# Patient Record
Sex: Male | Born: 1945 | Race: White | Hispanic: No
Health system: Southern US, Community
[De-identification: ages and names within clinical notes are randomized; demographics above are authoritative.]

## PROBLEM LIST (undated history)

## (undated) DIAGNOSIS — E119 Type 2 diabetes mellitus without complications: Secondary | ICD-10-CM

## (undated) HISTORY — PX: CARDIAC VALVE REPLACEMENT: SHX585

---

## 2012-10-06 ENCOUNTER — Ambulatory Visit: Payer: Self-pay | Admitting: Gastroenterology

## 2012-10-07 LAB — PATHOLOGY REPORT

## 2015-11-09 ENCOUNTER — Emergency Department: Payer: Medicare Other

## 2015-11-09 ENCOUNTER — Emergency Department
Admission: EM | Admit: 2015-11-09 | Discharge: 2015-11-09 | Disposition: A | Discharge status: Home / Self Care | Payer: Medicare Other | Attending: Emergency Medicine | Admitting: Emergency Medicine

## 2015-11-09 DIAGNOSIS — N200 Calculus of kidney: Secondary | ICD-10-CM

## 2015-11-09 DIAGNOSIS — N201 Calculus of ureter: Secondary | ICD-10-CM | POA: Diagnosis not present

## 2015-11-09 DIAGNOSIS — R1031 Right lower quadrant pain: Secondary | ICD-10-CM | POA: Diagnosis present

## 2015-11-09 DIAGNOSIS — R109 Unspecified abdominal pain: Secondary | ICD-10-CM

## 2015-11-09 LAB — URINALYSIS COMPLETE WITH MICROSCOPIC (ARMC ONLY)
Bilirubin Urine: NEGATIVE
Glucose, UA: >500 mg/dL — AB
LEUKOCYTES UA: NEGATIVE
Nitrite: NEGATIVE
PH: 5 (ref 5.0–8.0)
PROTEIN: 30 mg/dL — AB
SPECIFIC GRAVITY, URINE: 1.022 (ref 1.005–1.030)

## 2015-11-09 LAB — CBC WITH DIFFERENTIAL/PLATELET
BASOS PCT: 1 %
Basophils Absolute: 0.1 10*3/uL (ref 0–0.1)
EOS ABS: 0 10*3/uL (ref 0–0.7)
EOS PCT: 0 %
HCT: 45.1 % (ref 40.0–52.0)
Hemoglobin: 15.7 g/dL (ref 13.0–18.0)
LYMPHS ABS: 0.4 10*3/uL — AB (ref 1.0–3.6)
Lymphocytes Relative: 4 %
MCH: 32.6 pg (ref 26.0–34.0)
MCHC: 34.8 g/dL (ref 32.0–36.0)
MCV: 93.6 fL (ref 80.0–100.0)
MONOS PCT: 5 %
Monocytes Absolute: 0.6 10*3/uL (ref 0.2–1.0)
NEUTROS PCT: 90 %
Neutro Abs: 10.2 10*3/uL — ABNORMAL HIGH (ref 1.4–6.5)
Platelets: 215 10*3/uL (ref 150–440)
RBC: 4.82 MIL/uL (ref 4.40–5.90)
RDW: 13.5 % (ref 11.5–14.5)
WBC: 11.2 10*3/uL — ABNORMAL HIGH (ref 3.8–10.6)

## 2015-11-09 LAB — COMPREHENSIVE METABOLIC PANEL
ALBUMIN: 4.5 g/dL (ref 3.5–5.0)
ALT: 18 U/L (ref 17–63)
ANION GAP: 10 (ref 5–15)
AST: 24 U/L (ref 15–41)
Alkaline Phosphatase: 77 U/L (ref 38–126)
BUN: 21 mg/dL — ABNORMAL HIGH (ref 6–20)
CHLORIDE: 104 mmol/L (ref 101–111)
CO2: 26 mmol/L (ref 22–32)
Calcium: 9.5 mg/dL (ref 8.9–10.3)
Creatinine, Ser: 1.3 mg/dL — ABNORMAL HIGH (ref 0.61–1.24)
GFR calc Af Amer: >60 mL/min (ref >60)
GFR calc non Af Amer: 53 mL/min — ABNORMAL LOW (ref >60)
GLUCOSE: 355 mg/dL — AB (ref 65–99)
Potassium: 4.5 mmol/L (ref 3.5–5.1)
SODIUM: 140 mmol/L (ref 135–145)
Total Bilirubin: 1.3 mg/dL — ABNORMAL HIGH (ref 0.3–1.2)
Total Protein: 7.4 g/dL (ref 6.5–8.1)

## 2015-11-09 LAB — LIPASE, BLOOD: Lipase: 20 U/L (ref 11–51)

## 2015-11-09 MED ORDER — DEXTROSE 5 % IV SOLN
1.0000 g | Freq: Once | INTRAVENOUS | Status: AC
  Administered 2015-11-09: 1 g via INTRAVENOUS
  Filled 2015-11-09: qty 10

## 2015-11-09 MED ORDER — MORPHINE SULFATE (PF) 4 MG/ML IV SOLN
4.0000 mg | Freq: Once | INTRAVENOUS | Status: AC
  Administered 2015-11-09: 4 mg via INTRAVENOUS
  Filled 2015-11-09: qty 1

## 2015-11-09 MED ORDER — SODIUM CHLORIDE 0.9 % IV BOLUS (SEPSIS)
1000.0000 mL | Freq: Once | INTRAVENOUS | Status: AC
  Administered 2015-11-09: 1000 mL via INTRAVENOUS

## 2015-11-09 MED ORDER — TRAMADOL HCL 50 MG PO TABS: 50.0000 mg | ORAL_TABLET | Freq: Four times a day (QID) | ORAL | 0 refills | Status: AC | PRN

## 2015-11-09 MED ORDER — ONDANSETRON HCL 4 MG/2ML IJ SOLN
4.0000 mg | Freq: Once | INTRAMUSCULAR | Status: AC
  Administered 2015-11-09: 4 mg via INTRAVENOUS
  Filled 2015-11-09: qty 2

## 2015-11-09 MED ORDER — ONDANSETRON HCL 4 MG PO TABS: 4.0000 mg | ORAL_TABLET | Freq: Three times a day (TID) | ORAL | 0 refills | Status: DC | PRN

## 2015-11-09 MED ORDER — TAMSULOSIN HCL 0.4 MG PO CAPS: 0.4000 mg | ORAL_CAPSULE | Freq: Every day | ORAL | 0 refills | Status: DC

## 2015-11-09 MED ORDER — CEPHALEXIN 500 MG PO CAPS: 500.0000 mg | ORAL_CAPSULE | Freq: Three times a day (TID) | ORAL | 0 refills | Status: DC

## 2015-11-09 NOTE — Discharge Instructions (Signed)
As we discussed please talk to Dr. Burnadette Pop about obtaining further imaging of your kidneys. Please seek medical attention for any high fevers, chest pain, shortness of breath, change in behavior, persistent vomiting, bloody stool or any other new or concerning symptoms.

## 2015-11-09 NOTE — ED Notes (Signed)
Pt alert and oriented X4, active, cooperative, pt in NAD. RR even and unlabored, color WNL.  Pt informed to return if any life threatening symptoms occur.  Pt instructed not to drive home himself, that he needs to call ride due to medications given in ER. Pt verbalizes understanding.

## 2015-11-09 NOTE — ED Provider Notes (Signed)
[facility] Emergency Department Provider Note   ____________________________________________   I have reviewed the triage vital signs and the nursing notes.   HISTORY  Chief Complaint Abdominal Pain   History limited by: Not Limited   HPI Dominic Valentine is a 70 y.o. male who presents to the emergency department today because of concerns for abdominal pain. It started roughly 9 hours ago. It is located in the right lower quadrant. It is been a constant dull pain with occasional episodes of more severe pain. He has had some associated nausea but no vomiting. Hasn't noticed any recent dysuria or bad odor. No fevers. Denies similar symptoms in the past. States he does have family history of kidney stones.   No past medical history on file.  There are no active problems to display for this patient.   No past surgical history on file.  Prior to Admission medications   Not on File    Allergies Review of patient's allergies indicates no known allergies.  No family history on file.  Social History Social History  Substance Use Topics  . Smoking status: Not on file  . Smokeless tobacco: Not on file  . Alcohol use Not on file    Review of Systems  Constitutional: Negative for fever. Cardiovascular: Negative for chest pain. Respiratory: Negative for shortness of breath. Gastrointestinal: Positive right lower quadrant abdominal pain Genitourinary: Negative for dysuria. Musculoskeletal: Negative for back pain. Skin: Negative for rash. Neurological: Negative for headaches, focal weakness or numbness.  10-point ROS otherwise negative.  ____________________________________________   PHYSICAL EXAM:  VITAL SIGNS: ED Triage Vitals  Enc Vitals Group     BP 11/09/15 0428 (!) 177/99     Pulse Rate 11/09/15 0428 78     Resp 11/09/15 0428 18     Temp 11/09/15 0428 99.3 F (37.4 C)     Temp Source 11/09/15 0428 Oral     SpO2 11/09/15 0428 99 %      Weight 11/09/15 0422 170 lb (77.1 kg)     Height 11/09/15 0422 '5\' 11"'  (1.803 m)     Head Circumference --      Peak Flow --      Pain Score 11/09/15 0422 5   Constitutional: Alert and oriented. Well appearing and in no distress. Eyes: Conjunctivae are normal. Normal extraocular movements. ENT   Head: Normocephalic and atraumatic.   Nose: No congestion/rhinnorhea.   Mouth/Throat: Mucous membranes are moist.   Neck: No stridor. Hematological/Lymphatic/Immunilogical: No cervical lymphadenopathy. Cardiovascular: Normal rate, regular rhythm.  No murmurs, rubs, or gallops. Respiratory: Normal respiratory effort without tachypnea nor retractions. Breath sounds are clear and equal bilaterally. No wheezes/rales/rhonchi. Gastrointestinal: Soft and nontender. No distention.  Genitourinary: Deferred Musculoskeletal: Normal range of motion in all extremities. No lower extremity edema. Neurologic:  Normal speech and language. No gross focal neurologic deficits are appreciated.  Skin:  Skin is warm, dry and intact. No rash noted. Psychiatric: Mood and affect are normal. Speech and behavior are normal. Patient exhibits appropriate insight and judgment.  ____________________________________________    LABS (pertinent positives/negatives)  Labs Reviewed  CBC WITH DIFFERENTIAL/PLATELET - Abnormal; Notable for the following:       Result Value   WBC 11.2 (*)    Neutro Abs 10.2 (*)    Lymphs Abs 0.4 (*)    All other components within normal limits  COMPREHENSIVE METABOLIC PANEL - Abnormal; Notable for the following:    Glucose, Bld 355 (*)    BUN  21 (*)    Creatinine, Ser 1.30 (*)    Total Bilirubin 1.3 (*)    GFR calc non Af Amer 53 (*)    All other components within normal limits  URINALYSIS COMPLETEWITH MICROSCOPIC ([facility] ONLY) - Abnormal; Notable for the following:    Color, Urine YELLOW (*)    APPearance HAZY (*)    Glucose, UA >500 (*)    Ketones, ur 2+ (*)    Hgb  urine dipstick 3+ (*)    Protein, ur 30 (*)    Bacteria, UA RARE (*)    Squamous Epithelial / LPF 0-5 (*)    All other components within normal limits  LIPASE, BLOOD     ____________________________________________   EKG  None  ____________________________________________    RADIOLOGY  CT renal IMPRESSION:  1. Obstructive 5 mm stone at the right UVJ with secondary mild right  hydroureteronephrosis.  2. Additional 4 mm stone at the left UVJ without significant  left-sided hydronephrosis or hydroureter.  3. Multiple additional layering stones within the bladder lumen.  4. 5.3 cm mildly complex left renal cyst. Full characterization with  dedicated renal mass CT and/or MRI is recommended.  5. Colonic diverticulosis without evidence for acute diverticulitis.  6. 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  7. Mottled appearance of the visualized osseous structure with  question of subtle superimposed lucent lesions. While this finding  may be benign in nature, possible multiple myeloma could also have  this appearance. Correlation with laboratory values recommended.     ____________________________________________   PROCEDURES  Procedures  ____________________________________________   INITIAL IMPRESSION / ASSESSMENT AND PLAN / ED COURSE  Pertinent labs & imaging results that were available during my care of the patient were reviewed by me and considered in my medical decision making (see chart for details).  Patient presented to the emergency department today because of concerns for right flank pain. Patient did have some blood in his urine. CT scan was obtained which did show a 5 mm stone at the right UVJ. This is likely the cause of the patient's pain. Discussed this with the patient appeared also discussed with patient finding of complex cyst requiring further imaging. Will give patient does of antibiotics here given that he had some  white blood cells in his urine and very mild leukocytosis. I did however doubt that he truly has an infected stone at this point. Will give patient urology follow-up. Will also encourage primary care follow-up ____________________________________________   FINAL CLINICAL IMPRESSION(S) / ED DIAGNOSES  Final diagnoses:  Right flank pain  Kidney stone     Note: This dictation was prepared with Dragon dictation. Any transcriptional errors that result from this process are unintentional    Nance Pear, MD 11/09/15 [phone]

## 2015-11-09 NOTE — ED Notes (Signed)
Intermittent pain to the LLQ pt states "I feel like it's in my gut." Pt reports the pain starting last night after supper.

## 2015-11-09 NOTE — ED Triage Notes (Signed)
Patient ambulatory to triage with steady gait, without difficulty or distress noted; pt reports right lower abd pain since last night accomp by nausea 

## 2015-11-09 NOTE — ED Notes (Signed)
When asked pt how he felt pt states "I actually feel better."

## 2017-06-06 ENCOUNTER — Other Ambulatory Visit
Admission: RE | Admit: 2017-06-06 | Discharge: 2017-06-06 | Disposition: A | Discharge status: Home / Self Care | Payer: Medicare Other | Source: Ambulatory Visit | Attending: Family Medicine | Admitting: Family Medicine

## 2017-06-06 DIAGNOSIS — R791 Abnormal coagulation profile: Secondary | ICD-10-CM | POA: Diagnosis present

## 2017-06-06 LAB — PROTIME-INR
INR: 1.52
Prothrombin Time: 18.2 seconds — ABNORMAL HIGH (ref 11.4–15.2)

## 2017-10-14 ENCOUNTER — Encounter: Payer: Self-pay | Admitting: Anesthesiology

## 2017-10-14 ENCOUNTER — Ambulatory Visit: Payer: Medicare Other | Admitting: Anesthesiology

## 2017-10-14 ENCOUNTER — Ambulatory Visit
Admission: RE | Admit: 2017-10-14 | Discharge: 2017-10-14 | Disposition: A | Discharge status: Home / Self Care | Payer: Medicare Other | Source: Ambulatory Visit | Attending: Internal Medicine | Admitting: Internal Medicine

## 2017-10-14 ENCOUNTER — Encounter
Admission: RE | Disposition: A | Discharge status: Home / Self Care | Payer: Self-pay | Source: Ambulatory Visit | Attending: Internal Medicine

## 2017-10-14 DIAGNOSIS — E785 Hyperlipidemia, unspecified: Secondary | ICD-10-CM | POA: Insufficient documentation

## 2017-10-14 DIAGNOSIS — K640 First degree hemorrhoids: Secondary | ICD-10-CM | POA: Diagnosis not present

## 2017-10-14 DIAGNOSIS — I10 Essential (primary) hypertension: Secondary | ICD-10-CM | POA: Insufficient documentation

## 2017-10-14 DIAGNOSIS — I2510 Atherosclerotic heart disease of native coronary artery without angina pectoris: Secondary | ICD-10-CM | POA: Diagnosis not present

## 2017-10-14 DIAGNOSIS — R011 Cardiac murmur, unspecified: Secondary | ICD-10-CM | POA: Insufficient documentation

## 2017-10-14 DIAGNOSIS — K5730 Diverticulosis of large intestine without perforation or abscess without bleeding: Secondary | ICD-10-CM | POA: Insufficient documentation

## 2017-10-14 DIAGNOSIS — Z952 Presence of prosthetic heart valve: Secondary | ICD-10-CM | POA: Insufficient documentation

## 2017-10-14 DIAGNOSIS — Z89022 Acquired absence of left finger(s): Secondary | ICD-10-CM | POA: Diagnosis not present

## 2017-10-14 DIAGNOSIS — Z803 Family history of malignant neoplasm of breast: Secondary | ICD-10-CM | POA: Insufficient documentation

## 2017-10-14 DIAGNOSIS — Z8711 Personal history of peptic ulcer disease: Secondary | ICD-10-CM | POA: Insufficient documentation

## 2017-10-14 DIAGNOSIS — Z79899 Other long term (current) drug therapy: Secondary | ICD-10-CM | POA: Insufficient documentation

## 2017-10-14 DIAGNOSIS — E119 Type 2 diabetes mellitus without complications: Secondary | ICD-10-CM | POA: Diagnosis not present

## 2017-10-14 DIAGNOSIS — B351 Tinea unguium: Secondary | ICD-10-CM | POA: Insufficient documentation

## 2017-10-14 DIAGNOSIS — I428 Other cardiomyopathies: Secondary | ICD-10-CM | POA: Diagnosis not present

## 2017-10-14 DIAGNOSIS — Z86010 Personal history of colonic polyps: Secondary | ICD-10-CM | POA: Insufficient documentation

## 2017-10-14 DIAGNOSIS — Z794 Long term (current) use of insulin: Secondary | ICD-10-CM | POA: Insufficient documentation

## 2017-10-14 DIAGNOSIS — Z7901 Long term (current) use of anticoagulants: Secondary | ICD-10-CM | POA: Diagnosis not present

## 2017-10-14 DIAGNOSIS — R0600 Dyspnea, unspecified: Secondary | ICD-10-CM | POA: Insufficient documentation

## 2017-10-14 DIAGNOSIS — Z1211 Encounter for screening for malignant neoplasm of colon: Secondary | ICD-10-CM | POA: Diagnosis present

## 2017-10-14 HISTORY — PX: COLONOSCOPY WITH PROPOFOL: SHX5780

## 2017-10-14 HISTORY — DX: Type 2 diabetes mellitus without complications: E11.9

## 2017-10-14 LAB — GLUCOSE, CAPILLARY: GLUCOSE-CAPILLARY: 157 mg/dL — AB (ref 70–99)

## 2017-10-14 SURGERY — COLONOSCOPY WITH PROPOFOL
Anesthesia: General

## 2017-10-14 MED ORDER — FENTANYL CITRATE (PF) 100 MCG/2ML IJ SOLN
INTRAMUSCULAR | Status: DC | PRN
  Administered 2017-10-14: 50 ug via INTRAVENOUS

## 2017-10-14 MED ORDER — SODIUM CHLORIDE 0.9 % IV SOLN
INTRAVENOUS | Status: DC
  Administered 2017-10-14: 1000 mL via INTRAVENOUS

## 2017-10-14 MED ORDER — LIDOCAINE HCL (PF) 1 % IJ SOLN
INTRAMUSCULAR | Status: AC
  Administered 2017-10-14: 0.3 mL via INTRADERMAL
  Filled 2017-10-14: qty 2

## 2017-10-14 MED ORDER — PROPOFOL 500 MG/50ML IV EMUL
INTRAVENOUS | Status: AC
  Filled 2017-10-14: qty 50

## 2017-10-14 MED ORDER — MIDAZOLAM HCL 2 MG/2ML IJ SOLN
INTRAMUSCULAR | Status: AC
Start: 2017-10-14 — End: ?
  Filled 2017-10-14: qty 2

## 2017-10-14 MED ORDER — LIDOCAINE HCL (PF) 1 % IJ SOLN
2.0000 mL | Freq: Once | INTRAMUSCULAR | Status: AC
  Administered 2017-10-14: 0.3 mL via INTRADERMAL

## 2017-10-14 MED ORDER — FENTANYL CITRATE (PF) 100 MCG/2ML IJ SOLN
INTRAMUSCULAR | Status: AC
  Filled 2017-10-14: qty 2

## 2017-10-14 MED ORDER — PROPOFOL 500 MG/50ML IV EMUL
INTRAVENOUS | Status: DC | PRN
  Administered 2017-10-14: 120 ug/kg/min via INTRAVENOUS

## 2017-10-14 MED ORDER — MIDAZOLAM HCL 2 MG/2ML IJ SOLN
INTRAMUSCULAR | Status: DC | PRN
  Administered 2017-10-14: 1 mg via INTRAVENOUS

## 2017-10-14 NOTE — Transfer of Care (Signed)
Immediate Anesthesia Transfer of Care Note  Patient: Dominic Valentine  Procedure(s) Performed: COLONOSCOPY WITH PROPOFOL (N/A )  Patient Location: PACU  Anesthesia Type:General  Level of Consciousness: awake and sedated  Airway & Oxygen Therapy: Patient Spontanous Breathing and Patient connected to nasal cannula oxygen  Post-op Assessment: Report given to RN and Post -op Vital signs reviewed and stable  Post vital signs: Reviewed and stable  Last Vitals:  Vitals Value Taken Time  BP    Temp    Pulse    Resp    SpO2      Last Pain:  Vitals:   10/14/17 1219  TempSrc: Tympanic  PainSc: 0-No pain         Complications: No apparent anesthesia complications

## 2017-10-14 NOTE — Anesthesia Post-op Follow-up Note (Signed)
Anesthesia QCDR form completed.        

## 2017-10-14 NOTE — Interval H&P Note (Signed)
History and Physical Interval Note:  10/14/2017 1:06 PM  Dominic Valentine  has presented today for surgery, with the diagnosis of PERSONAL HX.OF COLON POLYPS  The various methods of treatment have been discussed with the patient and family. After consideration of risks, benefits and other options for treatment, the patient has consented to  Procedure(s): COLONOSCOPY WITH PROPOFOL (N/A) as a surgical intervention .  The patient's history has been reviewed, patient examined, no change in status, stable for surgery.  I have reviewed the patient's chart and labs.  Questions were answered to the patient's satisfaction.     [place], [place]

## 2017-10-14 NOTE — Anesthesia Postprocedure Evaluation (Signed)
Anesthesia Post Note  Patient: Dominic Valentine  Procedure(s) Performed: COLONOSCOPY WITH PROPOFOL (N/A )  Patient location during evaluation: Endoscopy Anesthesia Type: General Level of consciousness: awake and alert Pain management: pain level controlled Vital Signs Assessment: post-procedure vital signs reviewed and stable Respiratory status: spontaneous breathing and respiratory function stable Cardiovascular status: stable Anesthetic complications: no     Last Vitals:  Vitals:   10/14/17 1219  BP: 139/87  Pulse: 81  Resp: 17  Temp: (!) 36 C  SpO2: 100%    Last Pain:  Vitals:   10/14/17 1219  TempSrc: Tympanic  PainSc: 0-No pain                 Yaileen Hofferber K

## 2017-10-14 NOTE — H&P (Signed)
Outpatient short stay form Pre-procedure 10/14/2017 1:05 PM Dominic Valentine K. Dominic Valentine, M.D.  Primary Physician: Dominic Valentine, M.D.  Reason for visit: Personal hx of colon polyps.  History of present illness:  Patient presents for colonoscopy for a Personal hx of colon polyps.Patient denies change in bowel habits, rectal bleeding, weight loss or abdominal pain.     Current Facility-Administered Medications:  .  0.9 %  sodium chloride infusion, , Intravenous, Continuous, Savage, Boykin Nearing, MD, Last Rate: 20 mL/hr at 10/14/17 1238, 1,000 mL at 10/14/17 1238  Medications Prior to Admission  Medication Sig Dispense Refill Last Dose  . atorvastatin (LIPITOR) 40 MG tablet Take 40 mg by mouth daily.     Marland Kitchen b complex vitamins capsule Take 1 capsule by mouth daily.     . Cholecalciferol 10000 units TABS Take by mouth daily.     . CHROMIUM PO Take by mouth daily.     Marland Kitchen co-enzyme Q-10 30 MG capsule Take 30 mg by mouth every other day.     Marland Kitchen glimepiride (AMARYL) 4 MG tablet Take 4 mg by mouth daily with breakfast.     . insulin NPH-regular Human (NOVOLIN 70/30) (70-30) 100 UNIT/ML injection Inject 10 Units into the skin 2 (two) times daily with a meal.   10/14/2017 at 0800  . lisinopril (PRINIVIL,ZESTRIL) 5 MG tablet Take 5 mg by mouth daily.     . metFORMIN (GLUCOPHAGE) 1000 MG tablet Take 1,000 mg by mouth 2 (two) times daily with a meal. Take 1 qam and 1.5 qpm     . warfarin (COUMADIN) 10 MG tablet Take 10 mg by mouth daily. Qd  Except 12mg  on sat/sun   10/11/2017  . Zinc 50 MG CAPS Take by mouth daily.     . cephALEXin (KEFLEX) 500 MG capsule Take 1 capsule (500 mg total) by mouth 3 (three) times daily. 30 capsule 0   . ondansetron (ZOFRAN) 4 MG tablet Take 1 tablet (4 mg total) by mouth every 8 (eight) hours as needed. 20 tablet 0   . tamsulosin (FLOMAX) 0.4 MG CAPS capsule Take 1 capsule (0.4 mg total) by mouth daily. (Patient not taking: Reported on 10/13/2017) 14 capsule 0 Completed Course at  Unknown time     No Known Allergies   Past Medical History:  Diagnosis Date  . Diabetes mellitus without complication (HCC)     Review of systems:  Otherwise negative.    Physical Exam  Gen: Alert, oriented. Appears stated age.  HEENT: [place]/AT. PERRLA. Lungs: CTA, no wheezes. CV: RR nl S1, S2. Abd: soft, benign, no masses. BS+ Ext: No edema. Pulses 2+    Planned procedures: Proceed with colonoscopy. The patient understands the nature of the planned procedure, indications, risks, alternatives and potential complications including but not limited to bleeding, infection, perforation, damage to internal organs and possible oversedation/side effects from anesthesia. The patient agrees and gives consent to proceed.  Please refer to procedure notes for findings, recommendations and patient disposition/instructions.     Lamija Besse K. Dominic Valentine, M.D. Gastroenterology 10/14/2017  1:05 PM

## 2017-10-14 NOTE — Anesthesia Preprocedure Evaluation (Signed)
Anesthesia Evaluation  Patient identified by MRN, date of birth, ID band Patient awake    Reviewed: Allergy & Precautions, NPO status , Patient's Chart, lab work & pertinent test results, reviewed documented beta blocker date and time   Airway Mallampati: II  TM Distance: >3 FB     Dental  (+) Chipped   Pulmonary           Cardiovascular      Neuro/Psych    GI/Hepatic   Endo/Other  diabetes, Type 2  Renal/GU      Musculoskeletal   Abdominal   Peds  Hematology   Anesthesia Other Findings   Reproductive/Obstetrics                             Anesthesia Physical Anesthesia Plan  ASA: II  Anesthesia Plan: General   Post-op Pain Management:    Induction: Intravenous  PONV Risk Score and Plan:   Airway Management Planned: Oral ETT  Additional Equipment:   Intra-op Plan:   Post-operative Plan:   Informed Consent: I have reviewed the patients History and Physical, chart, labs and discussed the procedure including the risks, benefits and alternatives for the proposed anesthesia with the patient or authorized representative who has indicated his/her understanding and acceptance.     Plan Discussed with: CRNA  Anesthesia Plan Comments:         Anesthesia Quick Evaluation

## 2017-10-14 NOTE — Anesthesia Procedure Notes (Signed)
Performed by: Cook-Martin, Brayam Boeke Pre-anesthesia Checklist: Patient identified, Emergency Drugs available, Suction available, Patient being monitored and Timeout performed Patient Re-evaluated:Patient Re-evaluated prior to induction Oxygen Delivery Method: Nasal cannula Preoxygenation: Pre-oxygenation with 100% oxygen Induction Type: IV induction Placement Confirmation: positive ETCO2 and CO2 detector       

## 2017-10-14 NOTE — Op Note (Signed)
[facility]enterology Patient Name: Dominic Valentine Procedure Date: 10/14/2017 1:02 PM MRN: [id number] Account #: [id number] Date of Birth: 1945/12/03 Admit Type: Outpatient Age: 72 Room: [facility] ENDO ROOM 3 Gender: Male Note Status: Finalized Procedure:            Colonoscopy Indications:          High risk colon cancer surveillance: Personal history                        of colonic polyps Providers:            Boykin Nearing. [name] MD, MD Referring MD:         Marisue Ivan (Referring MD) Medicines:            Propofol per Anesthesia Complications:        No immediate complications. Procedure:            Pre-Anesthesia Assessment:                       - The risks and benefits of the procedure and the                        sedation options and risks were discussed with the                        patient. All questions were answered and informed                        consent was obtained.                       - Patient identification and proposed procedure were                        verified prior to the procedure by the nurse. The                        procedure was verified in the procedure room.                       - ASA Grade Assessment: III - A patient with severe                        systemic disease.                       - After reviewing the risks and benefits, the patient                        was deemed in satisfactory condition to undergo the                        procedure.                       After obtaining informed consent, the colonoscope was                        passed under direct vision. Throughout the procedure,  the patient's blood pressure, pulse, and oxygen                        saturations were monitored continuously. The                        Colonoscope was introduced through the anus and                        advanced to the the cecum, identified by appendiceal   orifice and ileocecal valve. The colonoscopy was                        performed without difficulty. The patient tolerated the                        procedure well. The quality of the bowel preparation                        was good. The ileocecal valve, appendiceal orifice, and                        rectum were photographed. Findings:      The perianal and digital rectal examinations were normal. Pertinent       negatives include normal sphincter tone.      A few small-mouthed diverticula were found in the sigmoid colon.      Non-bleeding internal hemorrhoids were found during retroflexion. The       hemorrhoids were Grade I (internal hemorrhoids that do not prolapse).      The exam was otherwise without abnormality. Impression:           - Diverticulosis in the sigmoid colon.                       - Non-bleeding internal hemorrhoids.                       - The examination was otherwise normal.                       - No specimens collected. Recommendation:       - Patient has a contact number available for                        emergencies. The signs and symptoms of potential                        delayed complications were discussed with the patient.                        Return to normal activities tomorrow. Written discharge                        instructions were provided to the patient.                       - Resume previous diet.                       - Continue present medications.                       -  Repeat colonoscopy in 5 years for surveillance.                       - The findings and recommendations were discussed with                        the patient and their family. Procedure Code(s):    --- Professional ---                       L5726, Colorectal cancer screening; colonoscopy on                        individual at high risk Diagnosis Code(s):    --- Professional ---                       K57.30, Diverticulosis of large intestine without                         perforation or abscess without bleeding                       K64.0, First degree hemorrhoids                       Z86.010, Personal history of colonic polyps CPT copyright 2017 [employer]. All rights reserved. The codes documented in this report are preliminary and upon coder review may  be revised to meet current compliance requirements. Stanton Kidney MD, MD 10/14/2017 1:23:11 PM This report has been signed electronically. Number of Addenda: 0 Note Initiated On: 10/14/2017 1:02 PM Scope Withdrawal Time: 0 hours 6 minutes 17 seconds  Total Procedure Duration: 0 hours 10 minutes 6 seconds       [facility]

## 2017-10-16 ENCOUNTER — Encounter: Payer: Self-pay | Admitting: Internal Medicine

## 2018-05-09 IMAGING — CT CT RENAL STONE PROTOCOL
2 of 3 series · 15 of 46 positions shown, 17 images · non-contrast
Comparison: None available.

CLINICAL DATA: Initial evaluation for intermittent left lower
quadrant abdominal pain.

EXAM:
CT ABDOMEN AND PELVIS WITHOUT CONTRAST
TECHNIQUE: Multidetector CT imaging of the abdomen and pelvis was performed
following the standard protocol without IV contrast.

[Series 2: axial st · axial · 0.73mm/px · z∈[-884,-444]mm · 12 of 102 slices shown, 14 images]
[im 7/102  soft-tissue]
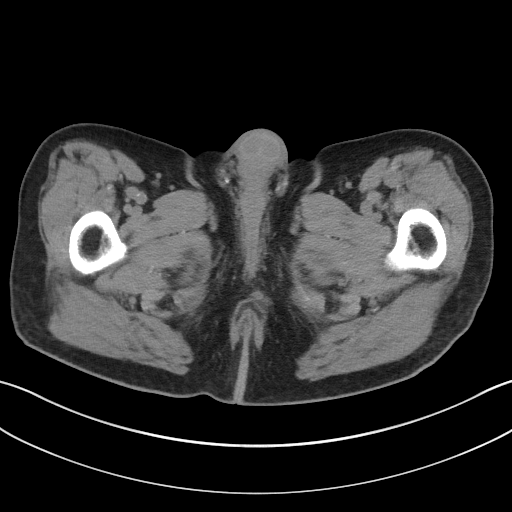
[im 7/102  bone]
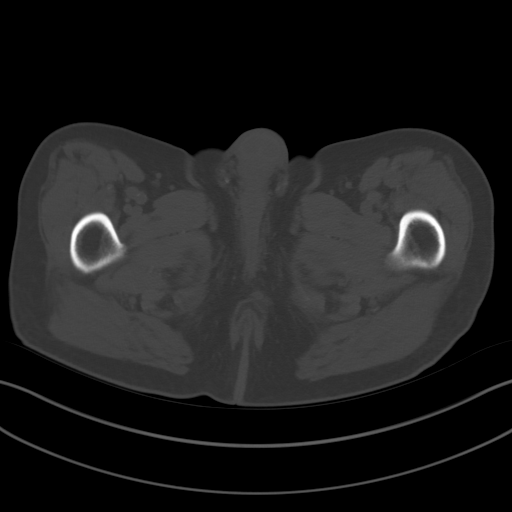
[im 14/102  soft-tissue]
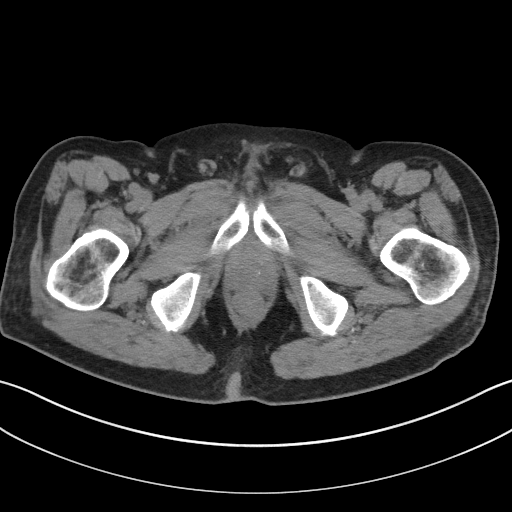
[im 23/102  soft-tissue]
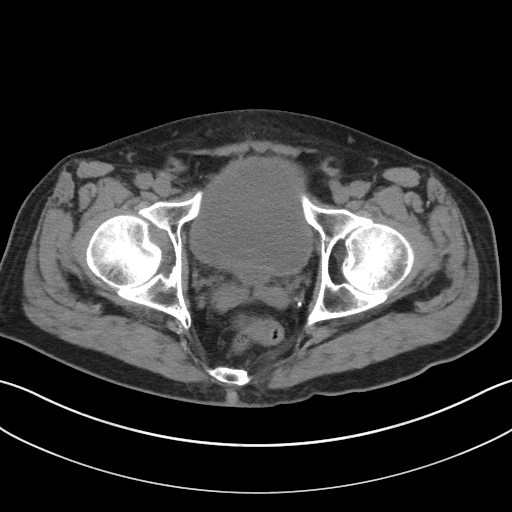
[im 30/102  soft-tissue]
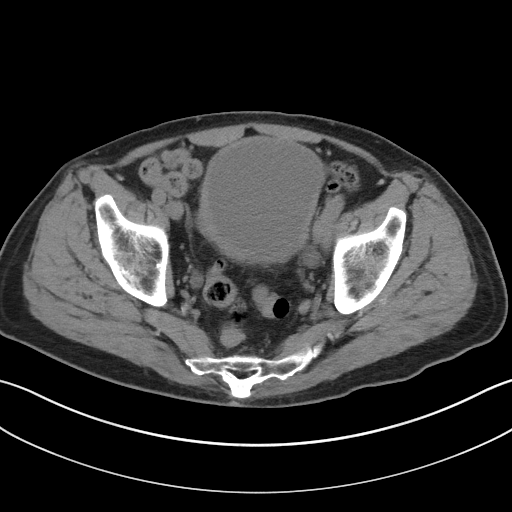
[im 40/102  soft-tissue]
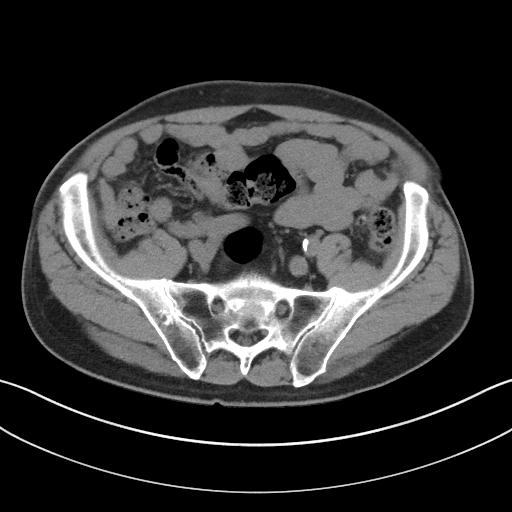
[im 46/102  soft-tissue]
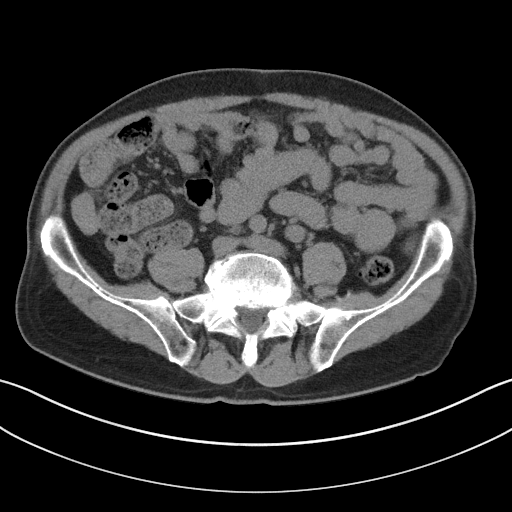
[im 56/102  soft-tissue]
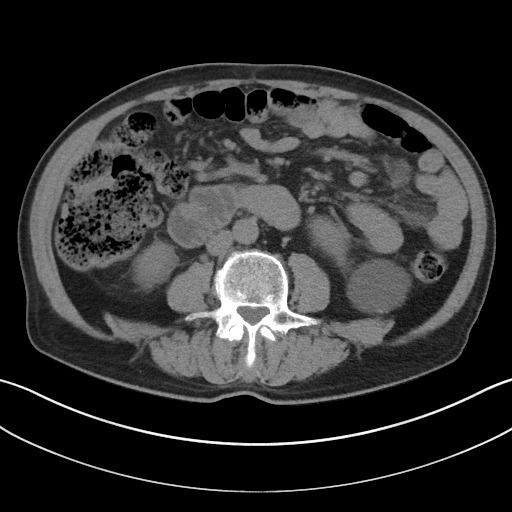
[im 62/102  soft-tissue]
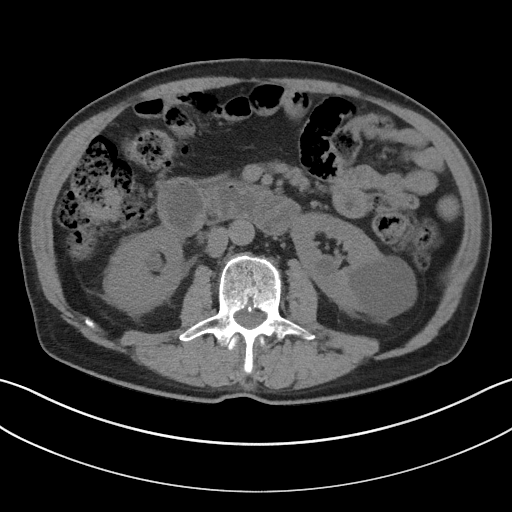
[im 72/102  soft-tissue]
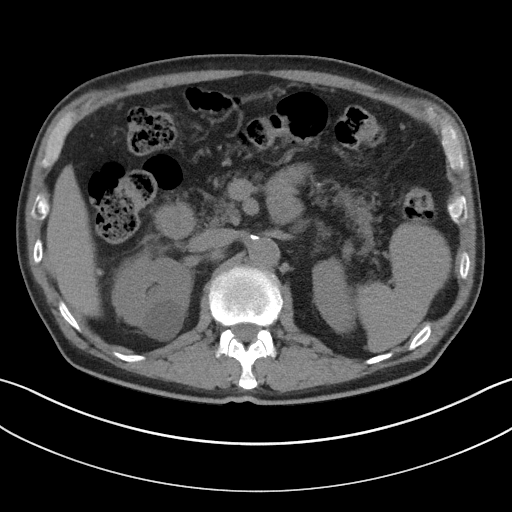
[im 72/102  bone]
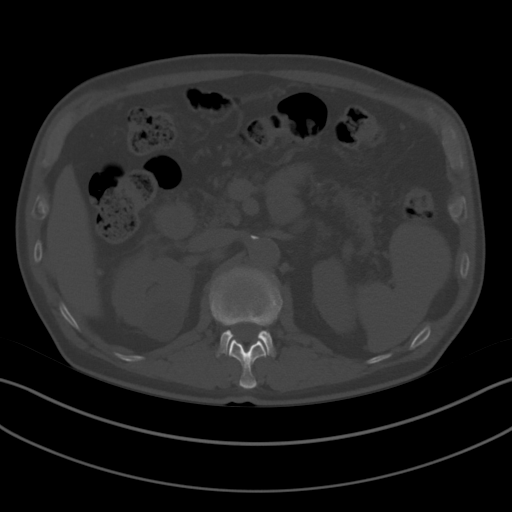
[im 79/102  soft-tissue]
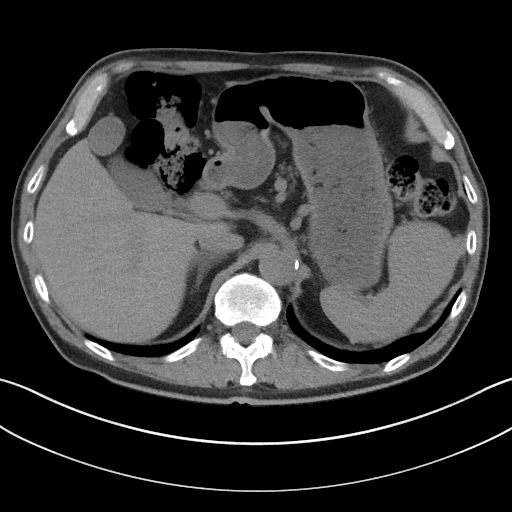
[im 88/102  soft-tissue]
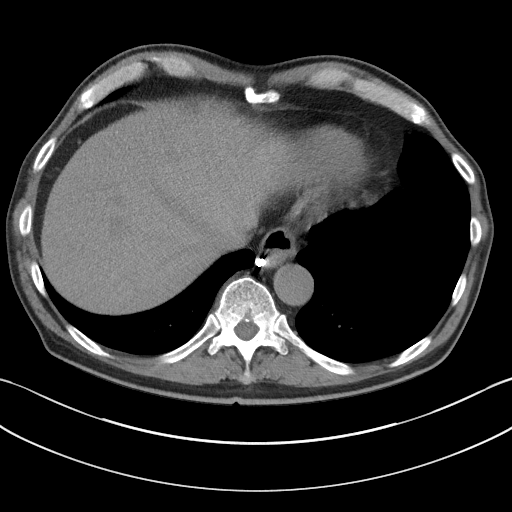
[im 95/102  soft-tissue]
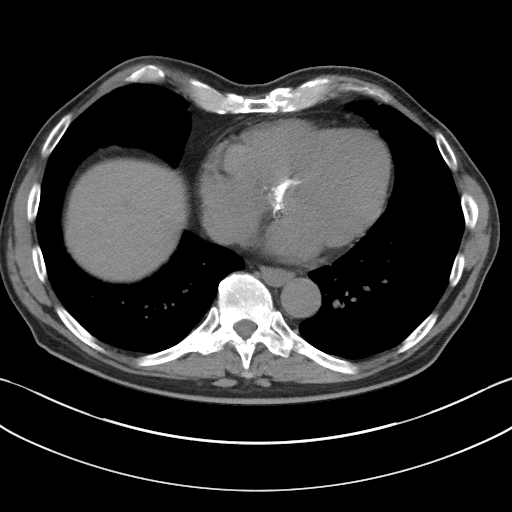

[Series 5: coronal · coronal · 0.85mm/px · 3 of 126 slices shown]
[im 42/126  soft-tissue]
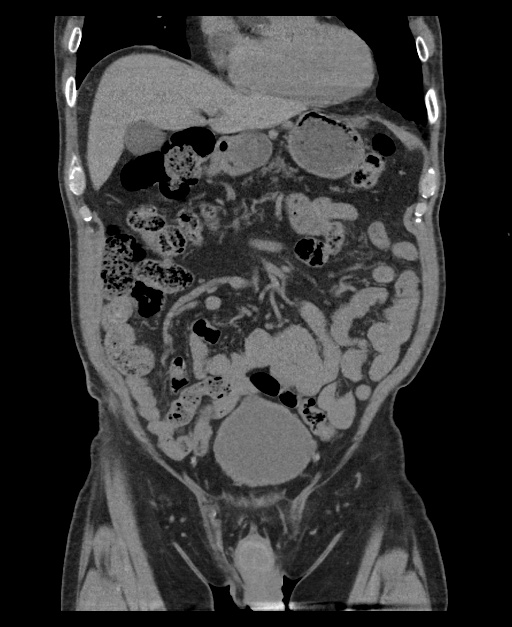
[im 56/126  soft-tissue]
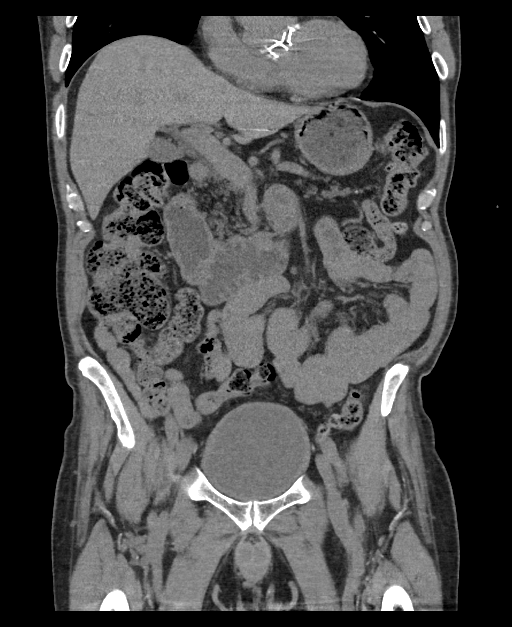
[im 70/126  soft-tissue]
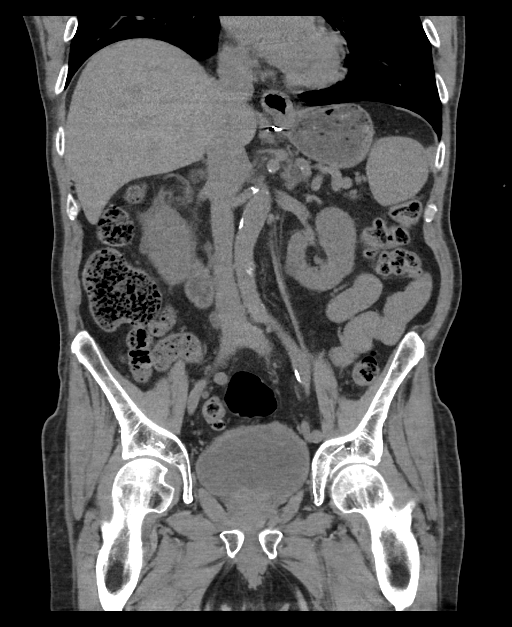

[15 of 46 positions shown; findings below may reference images not displayed]

FINDINGS: Lower chest: Visualized lung bases are clear. Prominent
calcifications or possibly valvular prosthesis noted at the aortic
valve, not well evaluated on this exam. Scattered coronary artery
calcifications also noted.

Hepatobiliary: Liver demonstrates a normal unenhanced appearance.
Gallbladder within normal limits. No biliary dilatation.

Pancreas: Fatty atrophy of the pancreas noted. No acute inflammatory
changes about the pancreas.

Spleen: Spleen within normal limits.

Adrenals/Urinary Tract: Mild diffuse thickening of the adrenal
glands bilaterally. 5 mm obstructive stone at the right UVJ with
secondary mild right hydroureteronephrosis. No other stones seen
within the right kidney or along the course of the right renal
collecting system. No made of a 8 3 cm right renal cyst. On the
left, there is an additional 4 mm stone positioned at the left UVJ
without significant left-sided hydronephrosis or hydroureter. No
other stones seen within the left kidney or on the long the course
of the left ureter. 5.3 cm exophytic cyst extending from the left
kidney is present. There is question of internal septation with
calcification within this lesion. Few additional smaller
hypodensities likely reflects small cyst. Mild circumferential
bladder wall thickening with few scattered diverticula about the
bladder, suspected to be related to chronic outlet obstruction.
Several additional stones seen layering posteriorly within the
bladder lumen (series 2, image 77).

Stomach/Bowel: Surgical clips present about the GE junction. Small
hiatal hernia present. Stomach otherwise unremarkable. No evidence
for bowel obstruction. Multiple colonic diverticula present within
the sigmoid colon without overt evidence for acute diverticulitis.
No acute inflammatory changes seen about the bowels.

Vascular/Lymphatic: Intra-abdominal aorta of normal caliber. Mild to
moderate atheromatous plaque present. No adenopathy.

Reproductive: Prostate is enlarged measuring 5 cm in transverse
diameter.

Other: No free air or fluid.

Musculoskeletal: No acute osseous abnormality. There are a few
scattered visualized osseous structures demonstrate no acute
abnormality. The visualized osseous structures demonstrate a
somewhat mottled appearance with multiple subtle lucent lesions
present. While these may be related to underlying osteopenic
changes, multiple myeloma could also conceivably have this
appearance.
IMPRESSION: 1. Obstructive 5 mm stone at the right UVJ with secondary mild right
hydroureteronephrosis.
2. Additional 4 mm stone at the left UVJ without significant
left-sided hydronephrosis or hydroureter.
3. Multiple additional layering stones within the bladder lumen.
4. 5.3 cm mildly complex left renal cyst. Full characterization with
dedicated renal mass CT and/or MRI is recommended.
5. Colonic diverticulosis without evidence for acute diverticulitis.
6. Enlarged prostate with suspected chronic bladder outlet
obstruction.
7. Mottled appearance of the visualized osseous structure with
question of subtle superimposed lucent lesions. While this finding
may be benign in nature, possible multiple myeloma could also have
this appearance. Correlation with laboratory values recommended.

## 2022-01-16 ENCOUNTER — Other Ambulatory Visit
Admission: RE | Admit: 2022-01-16 | Discharge: 2022-01-16 | Disposition: A | Discharge status: Home / Self Care | Payer: Medicare Other | Source: Ambulatory Visit | Attending: Family Medicine | Admitting: Family Medicine

## 2022-01-16 DIAGNOSIS — R791 Abnormal coagulation profile: Secondary | ICD-10-CM | POA: Diagnosis present

## 2022-01-16 DIAGNOSIS — Z952 Presence of prosthetic heart valve: Secondary | ICD-10-CM | POA: Insufficient documentation

## 2022-01-16 LAB — PROTIME-INR
INR: 9.6 (ref 0.8–1.2)
Prothrombin Time: 76.4 seconds — ABNORMAL HIGH (ref 11.4–15.2)

## 2022-01-17 ENCOUNTER — Other Ambulatory Visit
Admission: RE | Admit: 2022-01-17 | Discharge: 2022-01-17 | Disposition: A | Discharge status: Home / Self Care | Payer: Medicare Other | Source: Ambulatory Visit | Attending: Family Medicine | Admitting: Family Medicine

## 2022-01-17 DIAGNOSIS — Z952 Presence of prosthetic heart valve: Secondary | ICD-10-CM | POA: Insufficient documentation

## 2022-01-17 LAB — PROTIME-INR
INR: 7.7 (ref 0.8–1.2)
Prothrombin Time: 64.8 seconds — ABNORMAL HIGH (ref 11.4–15.2)

## 2022-01-24 ENCOUNTER — Other Ambulatory Visit: Payer: Self-pay | Admitting: Family Medicine

## 2022-01-24 DIAGNOSIS — E1169 Type 2 diabetes mellitus with other specified complication: Secondary | ICD-10-CM

## 2022-01-24 DIAGNOSIS — Z9189 Other specified personal risk factors, not elsewhere classified: Secondary | ICD-10-CM

## 2022-02-27 ENCOUNTER — Ambulatory Visit
Admission: RE | Admit: 2022-02-27 | Discharge: 2022-02-27 | Disposition: A | Discharge status: Home / Self Care | Payer: Medicare Other | Source: Ambulatory Visit | Attending: Family Medicine | Admitting: Family Medicine

## 2022-02-27 DIAGNOSIS — Z9189 Other specified personal risk factors, not elsewhere classified: Secondary | ICD-10-CM

## 2022-02-27 DIAGNOSIS — E1169 Type 2 diabetes mellitus with other specified complication: Secondary | ICD-10-CM

## 2023-09-19 DEATH — deceased
